# Patient Record
Sex: Male | Born: 1990 | Race: White | Hispanic: No | Marital: Single | Smoking: Former smoker
Health system: Southern US, Community
[De-identification: ages and names within clinical notes are randomized; demographics above are authoritative.]

## PROBLEM LIST (undated history)

## (undated) HISTORY — PX: WISDOM TOOTH EXTRACTION: SHX21

## (undated) HISTORY — PX: VASECTOMY: SHX75

---
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
## 2010-08-28 ENCOUNTER — Ambulatory Visit: Payer: Self-pay | Admitting: Internal Medicine

## 2016-03-25 ENCOUNTER — Ambulatory Visit: Admission: EM | Admit: 2016-03-25 | Discharge: 2016-03-25 | Discharge status: Absent without leave | Payer: 59

## 2016-11-30 DIAGNOSIS — B354 Tinea corporis: Secondary | ICD-10-CM | POA: Diagnosis not present

## 2017-01-05 DIAGNOSIS — L408 Other psoriasis: Secondary | ICD-10-CM | POA: Diagnosis not present

## 2017-01-19 DIAGNOSIS — Z0389 Encounter for observation for other suspected diseases and conditions ruled out: Secondary | ICD-10-CM | POA: Diagnosis not present

## 2017-01-19 DIAGNOSIS — Z125 Encounter for screening for malignant neoplasm of prostate: Secondary | ICD-10-CM | POA: Diagnosis not present

## 2017-01-19 DIAGNOSIS — Z008 Encounter for other general examination: Secondary | ICD-10-CM | POA: Diagnosis not present

## 2017-01-19 DIAGNOSIS — Z0000 Encounter for general adult medical examination without abnormal findings: Secondary | ICD-10-CM | POA: Diagnosis not present

## 2017-02-23 ENCOUNTER — Encounter: Payer: Self-pay | Admitting: *Deleted

## 2017-02-23 ENCOUNTER — Ambulatory Visit
Admission: EM | Admit: 2017-02-23 | Discharge: 2017-02-23 | Disposition: A | Discharge status: Home / Self Care | Payer: Worker's Compensation | Attending: Family Medicine | Admitting: Family Medicine

## 2017-02-23 ENCOUNTER — Ambulatory Visit (INDEPENDENT_AMBULATORY_CARE_PROVIDER_SITE_OTHER): Payer: Worker's Compensation

## 2017-02-23 DIAGNOSIS — S93402A Sprain of unspecified ligament of left ankle, initial encounter: Secondary | ICD-10-CM

## 2017-02-23 NOTE — ED Provider Notes (Addendum)
[facility] ____________________________________________  Time seen: Approximately 3:00PM  I have reviewed the triage vital signs and the nursing notes.   HISTORY  Chief Complaint Ankle Pain   HPI Richard Melendez is a 26 y.o. male presents for evaluation left lateral ankle pain post injury that occurred yesterday afternoon. 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 Patient states in this process of working with this person, the person kicked the inside of his left ankle causing him to roll his ankle outwards. Patient denies fall to the ground, head injury or loss of consciousness. Patient reports he has had left ankle pain since injury. Patient reports he is taking over-the-counter ibuprofen with some improvement. Patient states the pain is primarily with direct ambulation or with active range of motion. Patient reports has continued to tolerate weightbearing well. Denies any pain radiation, paresthesias or other injury. Patient states minimal to mild pain at this time. Denies other pain or injury or other complaints. 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 Patient states that he is off work today he returns tomorrow.  Denies chest pain, shortness of breath, abdominal pain, or rash. Denies recent sickness. Denies recent antibiotic use.    History reviewed. No pertinent past medical history. denies There are no active problems to display for this patient.   History reviewed. No pertinent surgical history.   No current facility-administered medications for this encounter.  No current outpatient prescriptions on file.  Allergies Patient has no known allergies.  History reviewed. No pertinent family history.  Social History Social History  Substance Use Topics  . Smoking status: Former Games developermoker  . Smokeless tobacco: Former NeurosurgeonUser  . Alcohol use Yes    Review of Systems Constitutional: No  fever/chills Cardiovascular: Denies chest pain. Respiratory: Denies shortness of breath. Gastrointestinal: No abdominal pain.   Genitourinary: Negative for dysuria. Musculoskeletal: Negative for back pain. As above.    ____________________________________________   PHYSICAL EXAM:  VITAL SIGNS: ED Triage Vitals  Enc Vitals Group     BP 02/23/17 1412 126/76     Pulse Rate 02/23/17 1412 71     Resp 02/23/17 1412 16     Temp 02/23/17 1412 97.8 F (36.6 C)     Temp Source 02/23/17 1412 Oral     SpO2 02/23/17 1412 99 %     Weight 02/23/17 1415 197 lb (89.4 kg)     Height 02/23/17 1415 5\' 9"  (1.753 m)     Head Circumference --      Peak Flow --      Pain Score 02/23/17 1415 5     Pain Loc --      Pain Edu? --      Excl. in GC? --     Constitutional: Alert and oriented. Well appearing and in no acute distress. ENT      Head: Normocephalic and atraumatic. Cardiovascular: Normal rate, regular rhythm. Grossly normal heart sounds.  Good peripheral circulation. Respiratory: Normal respiratory effort without tachypnea nor retractions. Breath sounds are clear and equal bilaterally. No wheezes, rales, rhonchi. Musculoskeletal:No midline cervical, thoracic or lumbar tenderness to palpation. Bilateral pedal pulses equal and easily palpated.  except: Left lateral malleolus mild point bony tenderness with mild surrounding soft tissue tenderness, mild edema and ecchymosis, skin intact, left ankle full range of motion, mild pain with resisted plantar flexion and dorsiflexion to lateral ankle, full range of motion present, no Achilles tenderness, left lower extremity otherwise  nontender, and left foot distal toes normal sensation and capillary refill. Neurologic:  Normal speech and language.Speech is normal. Skin:  Skin is warm, dry. Psychiatric: Mood and affect are normal. Speech and behavior are normal. Patient exhibits appropriate insight and judgment     ___________________________________________   LABS (all labs ordered are listed, but only abnormal results are displayed)  Labs Reviewed - No data to display ____________________________________________  RADIOLOGY  Dg Ankle Complete Left  Result Date: 02/23/2017 CLINICAL DATA:  Left ankle injury with pain and swelling laterally, initial encounter EXAM: LEFT ANKLE COMPLETE - 3+ VIEW COMPARISON:  None. FINDINGS: Mild soft tissue swelling is noted laterally. No acute fracture or dislocation is seen. IMPRESSION: Soft tissue swelling without acute bony abnormality. Electronically Signed   By: Alcide Clever M.D.   On: 02/23/2017 15:35   ____________________________________________  PROCEDURES Procedures   Velcro stirrup splint applied. Patient denies need for crutches.  INITIAL IMPRESSION / ASSESSMENT AND PLAN / ED COURSE  Pertinent labs & imaging results that were available during my care of the patient were reviewed by me and considered in my medical decision making (see chart for details).  Well-appearing patient. No acute distress. Presents for left lateral ankle pain post mechanical injury that occurred yesterday afternoon while at work. Denies other pain or injuries. Suspect sprain injury. Will evaluate x-ray. Left ankle x-ray per radiologist soft tissue swelling without acute bony abnormality. Velcro stirrup splint applied for support. Patient denies need for crutches. Encouraged rest, ice, elevation and over-the-counter NSAID use as needed. Patient states that he will be able to return to work tomorrow. Note given the patient may return to work tomorrow but continue use of splint. Discussed with patient to follow-up with [facility] Sor or orthopedic as needed for continued pain.  Discussed follow up with Primary care physician this week. Discussed follow up and return parameters including no resolution or any worsening concerns. Patient verbalized understanding and agreed to plan.    ____________________________________________   FINAL CLINICAL IMPRESSION(S) / ED DIAGNOSES  Final diagnoses:  Sprain of left ankle, unspecified ligament, initial encounter     There are no discharge medications for this patient.   Note: This dictation was prepared with Dragon dictation along with smaller phrase technology. Any transcriptional errors that result from this process are unintentional.         Renford Dills, NP 02/23/17 2225    Renford Dills, NP 02/23/17 2227

## 2017-02-23 NOTE — Discharge Instructions (Signed)
Ice. Rest. Elevate. Use splint as needed.   Follow up with [facility]co Sorom[facility] or orthopedic above as needed for continued pain.   Follow up with your primary care physician this week as needed. Return to Urgent care for new or worsening concerns.

## 2017-02-23 NOTE — ED Triage Notes (Signed)
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 Left ankle painful and edematous.

## 2019-04-16 ENCOUNTER — Other Ambulatory Visit: Payer: Self-pay

## 2019-04-16 ENCOUNTER — Telehealth: Payer: Self-pay | Admitting: *Deleted

## 2019-04-16 DIAGNOSIS — Z20822 Contact with and (suspected) exposure to covid-19: Secondary | ICD-10-CM

## 2019-04-16 NOTE — Telephone Encounter (Signed)
Pt scheduled at [facility] site today. Requested by [facility] Dept.  Possible exposure  Testing process reviewed, stay in car, wear mask; pt verbalizes understanding.   Pts CB# [phone]

## 2019-04-17 LAB — NOVEL CORONAVIRUS, NAA: SARS-CoV-2, NAA: NOT DETECTED

## 2019-05-13 IMAGING — CR DG ANKLE COMPLETE 3+V*L*
4 series · 4 of 4 positions shown · non-contrast
Comparison: None.

CLINICAL DATA: Left ankle injury with pain and swelling laterally,
initial encounter

EXAM:
LEFT ANKLE COMPLETE - 3+ VIEW

[ankle ap]
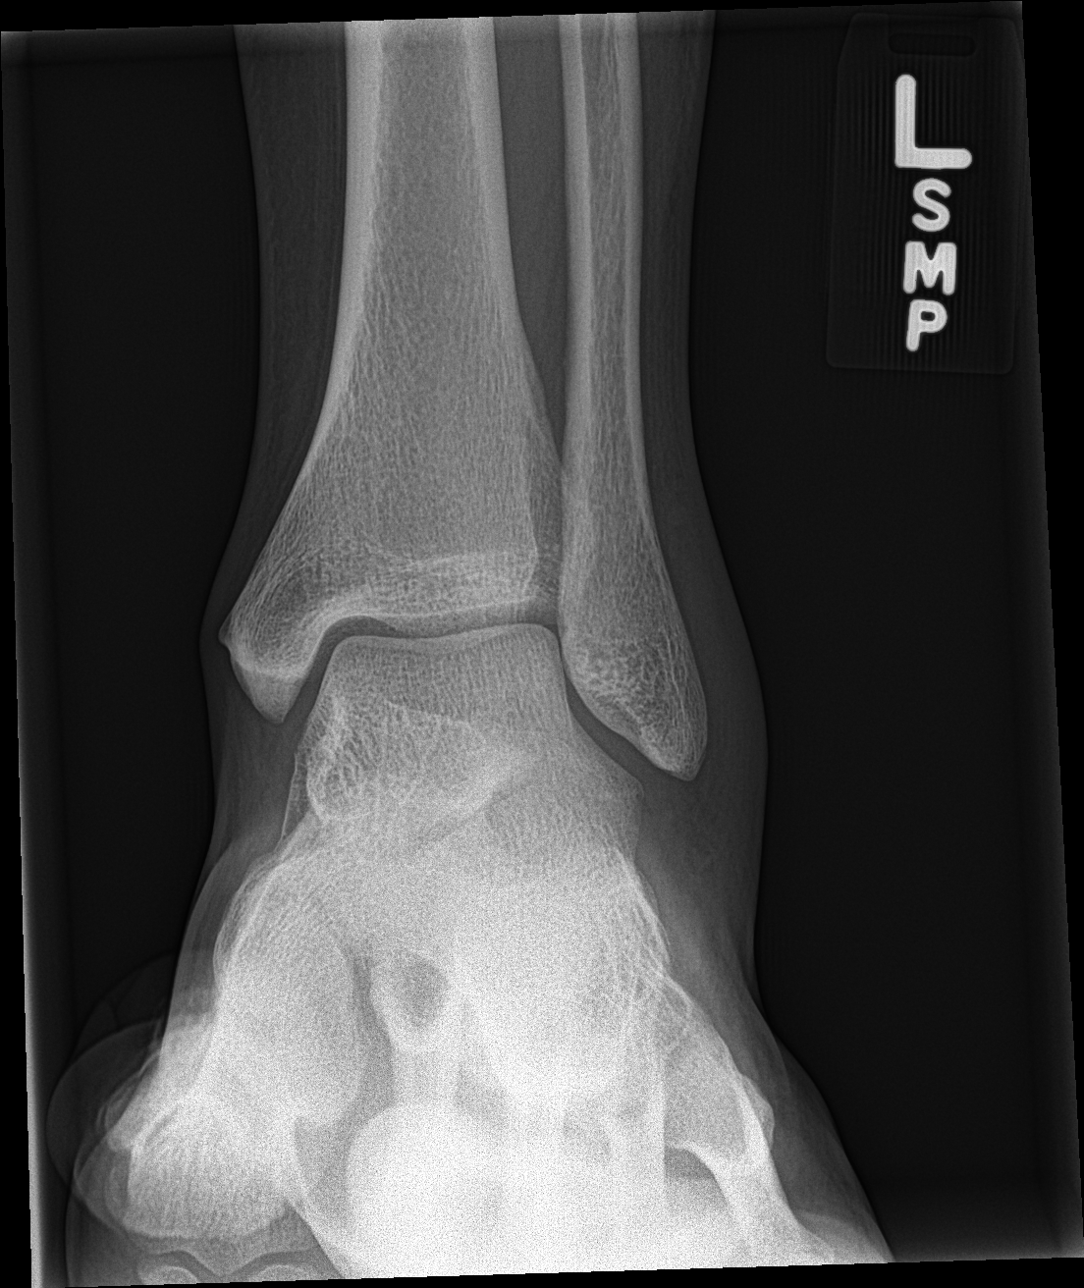

[ankle obl]
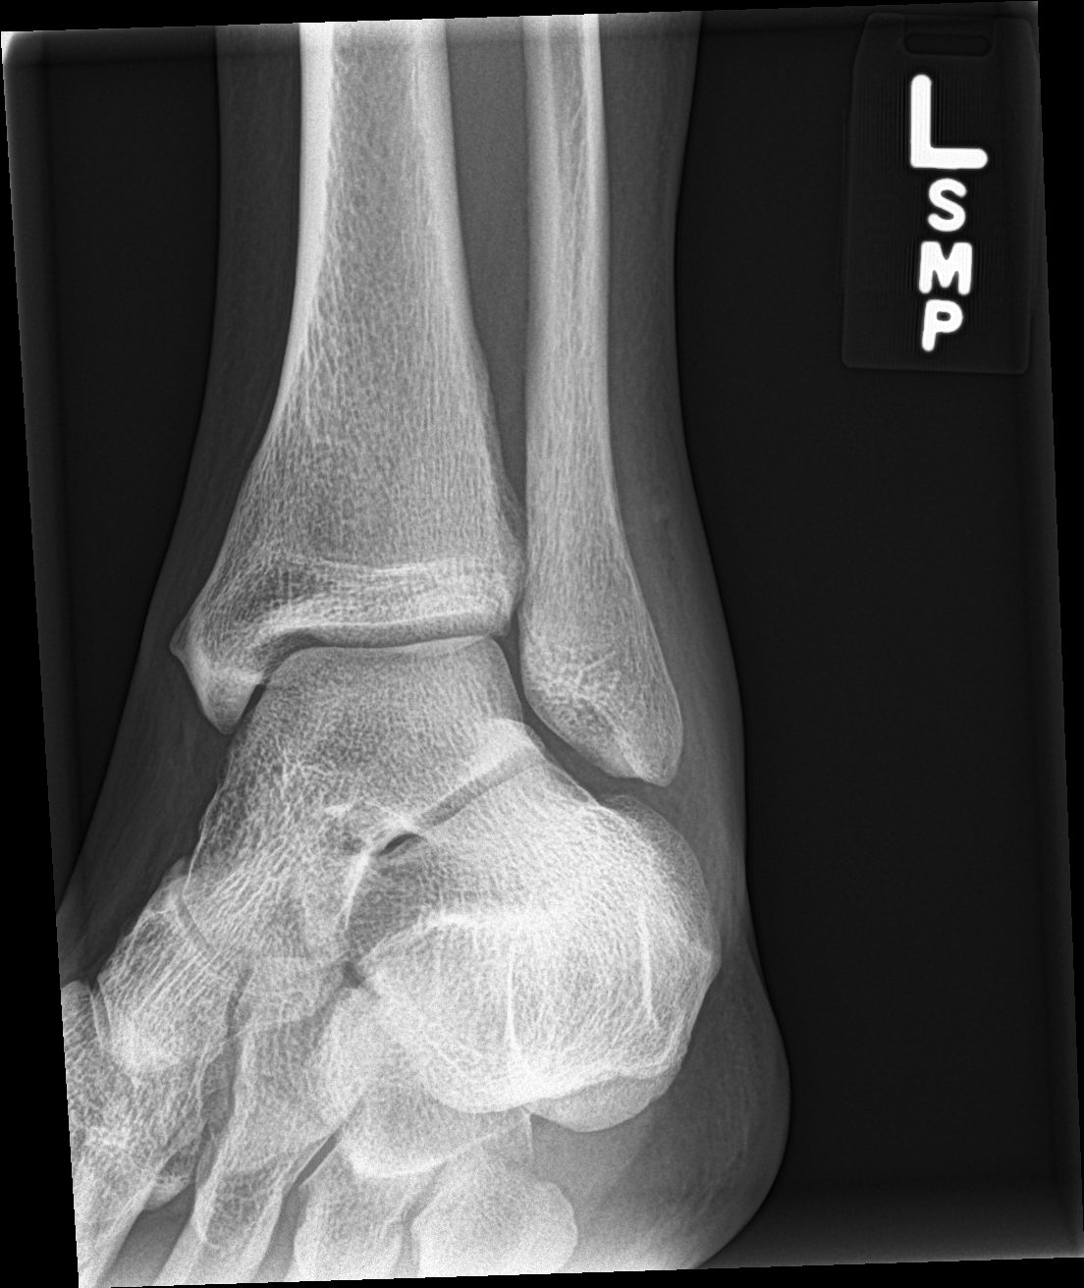

[ankle lat (1 of 2)]
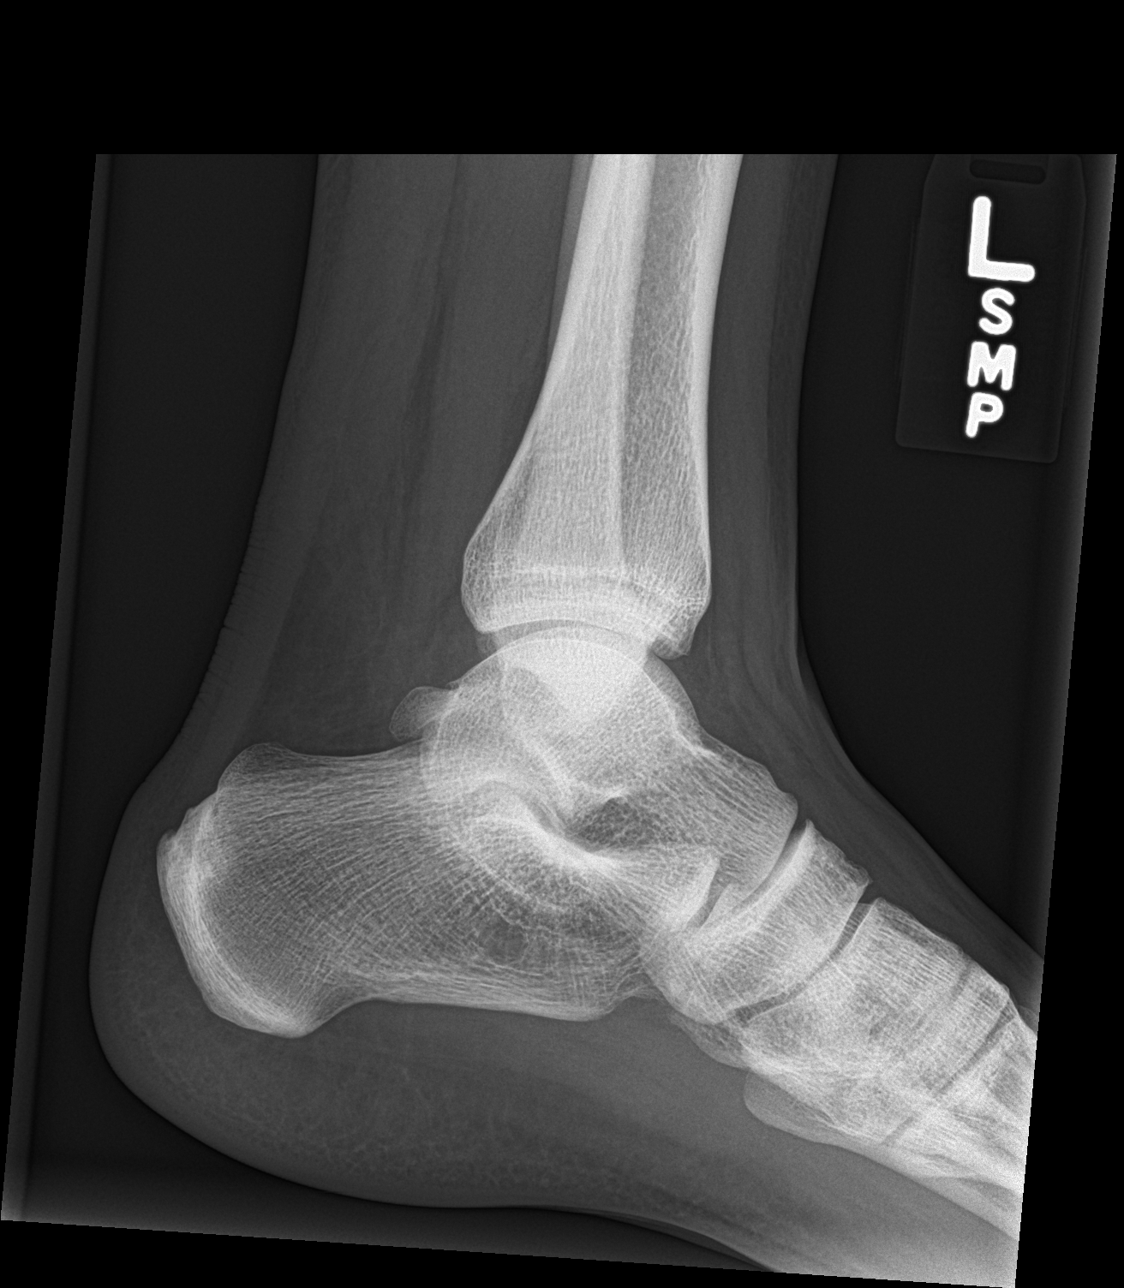

[ankle lat (2 of 2)]
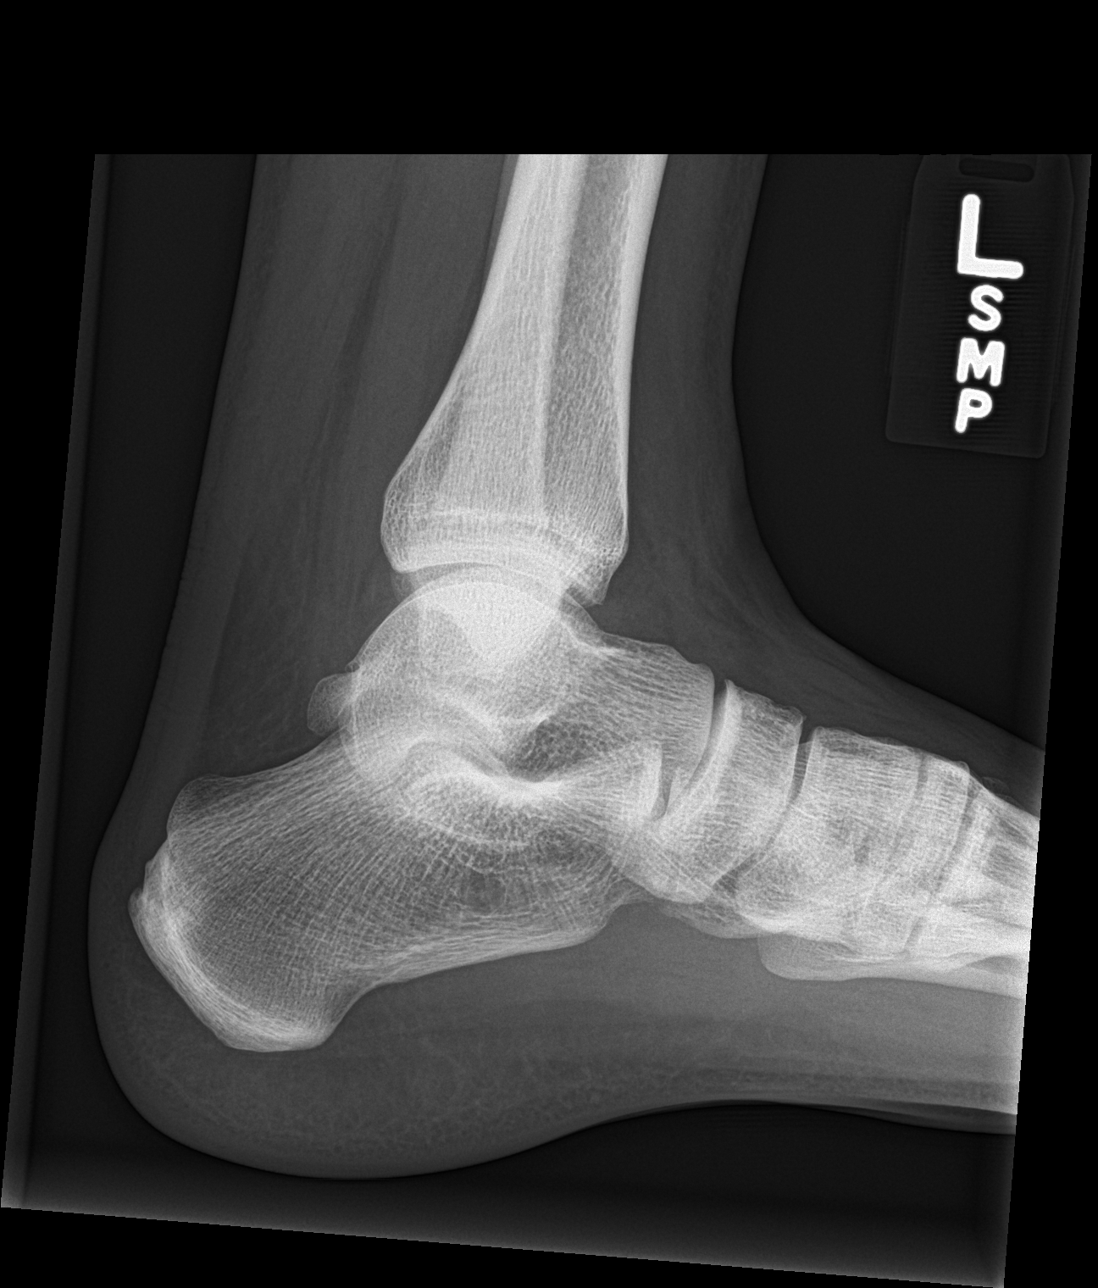

[4 of 4 positions shown; findings below may reference images not displayed]

FINDINGS: Mild soft tissue swelling is noted laterally. No acute fracture or
dislocation is seen.
IMPRESSION: Soft tissue swelling without acute bony abnormality.

## 2019-11-20 ENCOUNTER — Ambulatory Visit: Payer: 59 | Attending: Internal Medicine

## 2019-11-20 DIAGNOSIS — Z20822 Contact with and (suspected) exposure to covid-19: Secondary | ICD-10-CM

## 2019-11-21 LAB — NOVEL CORONAVIRUS, NAA: SARS-CoV-2, NAA: NOT DETECTED

## 2022-03-01 ENCOUNTER — Telehealth: Payer: Self-pay | Admitting: Emergency Medicine

## 2022-03-01 ENCOUNTER — Ambulatory Visit
Admission: EM | Admit: 2022-03-01 | Discharge: 2022-03-01 | Disposition: A | Discharge status: Home / Self Care | Payer: 59 | Attending: Urgent Care | Admitting: Urgent Care

## 2022-03-01 ENCOUNTER — Other Ambulatory Visit: Payer: Self-pay

## 2022-03-01 ENCOUNTER — Encounter: Payer: Self-pay | Admitting: Emergency Medicine

## 2022-03-01 DIAGNOSIS — Z20822 Contact with and (suspected) exposure to covid-19: Secondary | ICD-10-CM | POA: Diagnosis not present

## 2022-03-01 DIAGNOSIS — R509 Fever, unspecified: Secondary | ICD-10-CM | POA: Insufficient documentation

## 2022-03-01 DIAGNOSIS — J029 Acute pharyngitis, unspecified: Secondary | ICD-10-CM | POA: Diagnosis present

## 2022-03-01 DIAGNOSIS — G4700 Insomnia, unspecified: Secondary | ICD-10-CM | POA: Diagnosis not present

## 2022-03-01 DIAGNOSIS — Z87891 Personal history of nicotine dependence: Secondary | ICD-10-CM | POA: Insufficient documentation

## 2022-03-01 DIAGNOSIS — R5383 Other fatigue: Secondary | ICD-10-CM | POA: Insufficient documentation

## 2022-03-01 DIAGNOSIS — J039 Acute tonsillitis, unspecified: Secondary | ICD-10-CM | POA: Insufficient documentation

## 2022-03-01 LAB — GROUP A STREP BY PCR: Group A Strep by PCR: NOT DETECTED

## 2022-03-01 LAB — MONONUCLEOSIS SCREEN: Mono Screen: NEGATIVE

## 2022-03-01 LAB — SARS CORONAVIRUS 2 BY RT PCR: SARS Coronavirus 2 by RT PCR: NEGATIVE

## 2022-03-01 MED ORDER — AZITHROMYCIN 250 MG PO TABS: ORAL_TABLET | ORAL | 0 refills | Status: DC

## 2022-03-01 MED ORDER — AZITHROMYCIN 250 MG PO TABS: ORAL_TABLET | ORAL | 0 refills | Status: AC

## 2022-03-01 NOTE — Discharge Instructions (Addendum)
Your sore throat is likely related to tonsillitis. Stop the dexamethasone and cephalexin. Start azithromycin. Your strep test is negative. Your mono test is negative. Your covid test is negative. Please alternate tylenol with ibuprofen to help with the discomfort or fever. You may also try chloraseptic spray or cepacol lozenges. Monitor for fever >100.3, swollen lymph nodes or white spots on the back of the throat which may indicate a need to return to our clinic.

## 2022-03-01 NOTE — ED Triage Notes (Signed)
Started with fever on Wednesday afternoon.  Called out of work on Thursday.    Had a tele visit and was given a script cephalexin .  Started this medicine on Thursday.   Second tele health prescribed nsaid and steroid.  Patient has not taken prednisone correctly. Throat is still tender Called tele health again and was told to come here for a swab  Feeling better, but still running a fever of 100.

## 2022-03-01 NOTE — ED Provider Notes (Signed)
[facility]    CSN: [id number] Arrival date & time: 03/01/22  1406      History   Chief Complaint Chief Complaint  Patient presents with   Fever    HPI Richard Melendez is a 31 y.o. male.   31yo male patient presents today with concern of sore throat and fever x 1 week. Pt utilized the telehealth services through his [employer] on 02/24/22. Was prescribed cephalexin, which pt is still taking. States the symptoms did not improve with this, so he called back the following day on 02/25/22 and instructed to start dexamethasone 2mg  and naproxen for a suspected viral infection. Pt states he read the bottle wrong and was supposed to have taken the decadron all five pills at once, but instead has been taking one a day. It has caused severe insomnia, so he is cutting them in half and taking only 1mg  daily. He feels that the napoxen has helped the most, but is concerned because he still had a fever one week later, Tmax was 103. He admits to feeling fatigued. He was a former smoker, but states he quit two weeks ago.    Fever  History reviewed. No pertinent past medical history.  There are no problems to display for this patient.   Past Surgical History:  Procedure Laterality Date   VASECTOMY     WISDOM TOOTH EXTRACTION         Home Medications    Prior to Admission medications   Medication Sig Start Date End Date Taking? Authorizing Provider  azithromycin (ZITHROMAX Z-PAK) 250 MG tablet Take two tabs PO day one, followed by one tab PO day 2-5 03/01/22  Yes Wrenna Saks L, PA    Family History Family History  Problem Relation Age of Onset   Healthy Mother    Healthy Father     Social History Social History   Tobacco Use   Smoking status: Former   Smokeless tobacco: Former  Use: Never used  Substance Use Topics   Alcohol use: Yes   Drug use: Never     Allergies   Patient has no known allergies.   Review of  Systems Review of Systems  Constitutional:  Positive for fever.    Physical Exam Triage Vital Signs ED Triage Vitals  Enc Vitals Group     BP 03/01/22 1444 121/82     Pulse Rate 03/01/22 1444 (!) 101     Resp 03/01/22 1444 18     Temp 03/01/22 1444 98.3 F (36.8 C)     Temp Source 03/01/22 1444 Oral     SpO2 03/01/22 1444 100 %     Weight --      Height --      Head Circumference --      Peak Flow --      Pain Score 03/01/22 1441 3     Pain Loc --      Pain Edu? --      Excl. in GC? --    No data found.  Updated Vital Signs BP 121/82 (BP Location: Left Arm)   Pulse (!) 101   Temp 98.3 F (36.8 C) (Oral)   Resp 18   SpO2 100%   Visual Acuity Right Eye Distance:   Left Eye Distance:   Bilateral Distance:    Right Eye Near:   Left Eye Near:    Bilateral Near:     Physical Exam Vitals and nursing  note reviewed.  Constitutional:      General: He is not in acute distress.    Appearance: Normal appearance. He is well-developed and normal weight. He is not ill-appearing, toxic-appearing or diaphoretic.  HENT:     Head: Normocephalic and atraumatic.     Salivary Glands: Right salivary gland is not diffusely enlarged or tender. Left salivary gland is not diffusely enlarged or tender.     Right Ear: Tympanic membrane, ear canal and external ear normal. No drainage, swelling or tenderness. No middle ear effusion. There is no impacted cerumen. Tympanic membrane is not erythematous.     Left Ear: Tympanic membrane, ear canal and external ear normal. No drainage, swelling or tenderness.  No middle ear effusion. There is no impacted cerumen. Tympanic membrane is not erythematous.     Nose: No congestion or rhinorrhea.     Right Turbinates: Not enlarged or swollen.     Left Turbinates: Not enlarged or swollen.     Right Sinus: No maxillary sinus tenderness or frontal sinus tenderness.     Left Sinus: No maxillary sinus tenderness or frontal sinus tenderness.      Mouth/Throat:     Mouth: Mucous membranes are moist. No oral lesions.     Pharynx: Oropharynx is clear. Uvula midline. Posterior oropharyngeal erythema present. No pharyngeal swelling, oropharyngeal exudate or uvula swelling.     Tonsils: Tonsillar exudate present. No tonsillar abscesses. 1+ on the right. 2+ on the left.  Eyes:     Extraocular Movements: Extraocular movements intact.     Conjunctiva/sclera: Conjunctivae normal.     Pupils: Pupils are equal, round, and reactive to light.  Neck:     Thyroid: No thyromegaly.  Cardiovascular:     Rate and Rhythm: Normal rate and regular rhythm.     Heart sounds: No murmur heard.   No gallop.  Pulmonary:     Effort: Pulmonary effort is normal. No respiratory distress.     Breath sounds: Normal breath sounds. No stridor. No wheezing, rhonchi or rales.  Chest:     Chest wall: No tenderness.  Abdominal:     General: Bowel sounds are normal. There is no distension.     Palpations: Abdomen is soft. There is no mass.     Tenderness: There is no abdominal tenderness. There is no rebound.  Musculoskeletal:     Cervical back: Normal range of motion and neck supple. No rigidity or tenderness.  Lymphadenopathy:     Cervical: No cervical adenopathy.  Skin:    General: Skin is warm.     Findings: No erythema or rash.  Neurological:     General: No focal deficit present.     Mental Status: He is alert and oriented to person, place, and time.  Psychiatric:        Mood and Affect: Mood normal.        Behavior: Behavior normal.     UC Treatments / Results  Labs (all labs ordered are listed, but only abnormal results are displayed) Labs Reviewed  GROUP A STREP BY PCR  SARS CORONAVIRUS 2 BY RT PCR  MONONUCLEOSIS SCREEN    EKG   Radiology No results found.  Procedures Procedures (including critical care time)  Medications Ordered in UC Medications - No data to display  Initial Impression / Assessment and Plan / UC Course  I have  reviewed the triage vital signs and the nursing notes.  Pertinent labs & imaging results that were available during my care  of the patient were reviewed by me and considered in my medical decision making (see chart for details).     Acute tonsillitis - stop cephalexin, switch to azithromycin to cover for atypicals. Stop dexamethasone and naproxen. Tylenol or ibuprofen if needed. Salt water gargles. RTC precautions discussed.  Final Clinical Impressions(s) / UC Diagnoses   Final diagnoses:  Acute tonsillitis, unspecified etiology     Discharge Instructions      Your sore throat is likely related to tonsillitis. Stop the dexamethasone and cephalexin. Start azithromycin. Your strep test is negative. Your mono test is negative. Your covid test is negative. Please alternate tylenol with ibuprofen to help with the discomfort or fever. You may also try chloraseptic spray or cepacol lozenges. Monitor for fever >100.3, swollen lymph nodes or white spots on the back of the throat which may indicate a need to return to our clinic.      ED Prescriptions     Medication Sig Dispense Auth. Provider   azithromycin (ZITHROMAX Z-PAK) 250 MG tablet Take two tabs PO day one, followed by one tab PO day 2-5 6 tablet Kristyana Notte L, PA      PDMP not reviewed this encounter.   Maretta Bees, [place] 03/01/22 1614

## 2022-03-05 ENCOUNTER — Telehealth: Payer: 59 | Admitting: Physician Assistant

## 2022-03-05 DIAGNOSIS — J029 Acute pharyngitis, unspecified: Secondary | ICD-10-CM

## 2022-03-05 NOTE — Patient Instructions (Signed)
  Lawrence Marseilles, thank you for joining Margaretann Loveless, PA-C for today's virtual visit.  While this provider is not your primary care provider (PCP), if your PCP is located in our provider database this encounter information will be shared with them immediately following your visit.  Consent: (Patient) Richard Melendez provided verbal consent for this virtual visit at the beginning of the encounter.  Current Medications:  Current Outpatient Medications:    azithromycin (ZITHROMAX Z-PAK) 250 MG tablet, Take two tabs PO day one, followed by one tab PO day 2-5, Disp: 6 tablet, Rfl: 0   Medications ordered in this encounter:  No orders of the defined types were placed in this encounter.    *If you need refills on other medications prior to your next appointment, please contact your pharmacy*  Follow-Up: Call back or seek an in-person evaluation if the symptoms worsen or if the condition fails to improve as anticipated.   If you have been instructed to have an in-person evaluation today at a local Urgent Care facility, please use the link below. It will take you to a list of all of our available [facility], including address, phone number and hours of operation. Please do not delay care.  [facility]  If you or a family member do not have a primary care provider, use the link below to schedule a visit and establish care. When you choose a [facility] primary care physician or advanced practice provider, you gain a long-term partner in health. Find a Primary Care Provider  Learn more about [facility]'s in-office and virtual care options: [facility] - Get Care Now

## 2022-03-05 NOTE — Progress Notes (Signed)
Virtual Visit Consent   Richard Melendez, you are scheduled for a virtual visit with a [facility] provider today. Just as with appointments in the office, your consent must be obtained to participate. Your consent will be active for this visit and any virtual visit you may have with one of our providers in the next 365 days. If you have a MyChart account, a copy of this consent can be sent to you electronically.  As this is a virtual visit, video technology does not allow for your provider to perform a traditional examination. This may limit your provider's ability to fully assess your condition. If your provider identifies any concerns that need to be evaluated in person or the need to arrange testing (such as labs, EKG, etc.), we will make arrangements to do so. Although advances in technology are sophisticated, we cannot ensure that it will always work on either your end or our end. If the connection with a video visit is poor, the visit may have to be switched to a telephone visit. With either a video or telephone visit, we are not always able to ensure that we have a secure connection.  By engaging in this virtual visit, you consent to the provision of healthcare and authorize for your insurance to be billed (if applicable) for the services provided during this visit. Depending on your insurance coverage, you may receive a charge related to this service.  I need to obtain your verbal consent now. Are you willing to proceed with your visit today? Richard Melendez has provided verbal consent on 03/05/2022 for a virtual visit (video or telephone). Margaretann Loveless, PA-C  Date: 03/05/2022 12:20 PM  Virtual Visit via Video Note   I, Margaretann Loveless, connected with  Richard Melendez  ([id number], 1990-12-14) on 03/05/22 at 12:00 PM EDT by a video-enabled telemedicine application and verified that I am speaking with the correct person using two identifiers.  Location: Patient: Virtual Visit  Location Patient: Home Provider: Virtual Visit Location Provider: Home Office   I discussed the limitations of evaluation and management by telemedicine and the availability of in person appointments. The patient expressed understanding and agreed to proceed.    History of Present Illness: Richard Melendez is a 31 y.o. who identifies as a male who was assigned male at birth, and is being seen today for sore throat.  HPI: Sore Throat  This is a recurrent problem. The current episode started 1 to 4 weeks ago. The problem has been unchanged. Sore throat worse side: both. There has been no fever (no fever currently; did have about 5 days of fever prior). The fever has been present for 5 days or more. Associated symptoms include congestion, ear pain, a plugged ear sensation and swollen glands. Pertinent negatives include no hoarse voice or trouble swallowing. He has had no exposure to strep or mono. Treatments tried: see below. The treatment provided no relief.   Was seen virtually on 02/24/22 and started on Keflex. Seen again on 02/25/22 and had dexamethasone and naproxen added. Seen again virtually on 03/01/22 and deferred to in-person UC. Had strep, mono and covid testing done, all negative. Keflex was changed to Azithromycin. Dexamethasone and Naproxen were stopped. Advised to take ibuprofen and tylenol.   Problems: There are no problems to display for this patient.   Allergies: No Known Allergies Medications:  Current Outpatient Medications:    azithromycin (ZITHROMAX Z-PAK) 250 MG tablet, Take two tabs PO day one, followed by  one tab PO day 2-5, Disp: 6 tablet, Rfl: 0  Observations/Objective: Patient is well-developed, well-nourished in no acute distress.  Resting comfortably at home.  Head is normocephalic, atraumatic.  No labored breathing.  Speech is clear and coherent with logical content.  Patient is alert and oriented at baseline.    Assessment and Plan: 1. Sore throat  -  Advised that since symptoms are still present he should be re-evaluated in person to have a throat culture obtained - He voiced understanding and agrees to have this done  Follow Up Instructions: I discussed the assessment and treatment plan with the patient. The patient was provided an opportunity to ask questions and all were answered. The patient agreed with the plan and demonstrated an understanding of the instructions.  A copy of instructions were sent to the patient via MyChart unless otherwise noted below.    The patient was advised to call back or seek an in-person evaluation if the symptoms worsen or if the condition fails to improve as anticipated.  Time:  I spent 14 minutes with the patient via telehealth technology discussing the above problems/concerns.    Margaretann Loveless, PA-C
